# Patient Record
Sex: Male | Born: 1990 | Race: White | Hispanic: No | Marital: Single | Smoking: Never smoker
Health system: Southern US, Community
[De-identification: ages and names within clinical notes are randomized; demographics above are authoritative.]

## PROBLEM LIST (undated history)

## (undated) DIAGNOSIS — Z8719 Personal history of other diseases of the digestive system: Secondary | ICD-10-CM

## (undated) DIAGNOSIS — L600 Ingrowing nail: Secondary | ICD-10-CM

## (undated) DIAGNOSIS — B070 Plantar wart: Secondary | ICD-10-CM

## (undated) DIAGNOSIS — M24849 Other specific joint derangements of unspecified hand, not elsewhere classified: Secondary | ICD-10-CM

## (undated) HISTORY — DX: Plantar wart: B07.0

## (undated) HISTORY — DX: Personal history of other diseases of the digestive system: Z87.19

## (undated) HISTORY — DX: Other specific joint derangements of unspecified hand, not elsewhere classified: M24.849

## (undated) HISTORY — DX: Ingrowing nail: L60.0

---
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
## 2011-12-12 IMAGING — CT CT ABD-PELV W/ CM
2 of 4 series · 13 of 32 positions shown, 18 images · IV contrast (water/omni  & 100ml omni 300)
Comparison: None

CLINICAL DATA: Dysuria.  Painful urination.  Right-sided pain.

CT ABDOMEN AND PELVIS WITH CONTRAST
TECHNIQUE: Multidetector CT imaging of the abdomen and pelvis was
performed following the standard protocol during bolus
administration of intravenous contrast.
Contrast: 100 ml 4mnipaque-DRR

[Series 2: routine abdomen · axial · 0.70mm/px · z∈[-430,-90]mm · 5 of 104 slices shown, 10 images]
[im 18/104  soft-tissue]
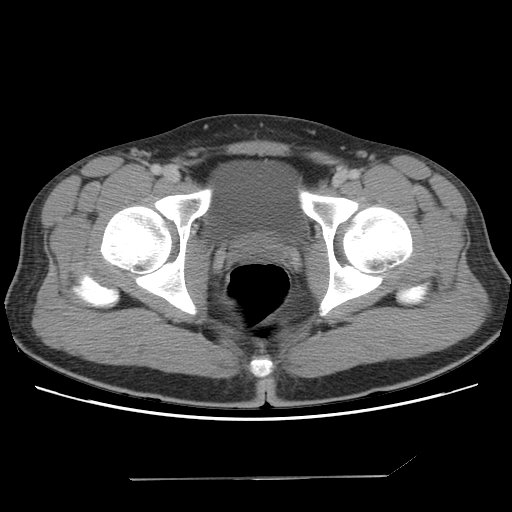
[im 18/104  bone]
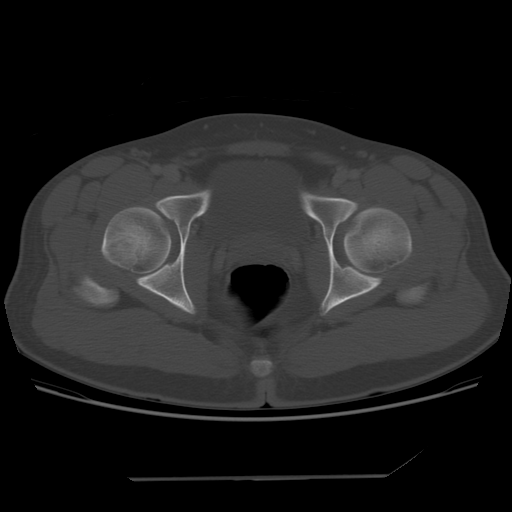
[im 35/104  soft-tissue]
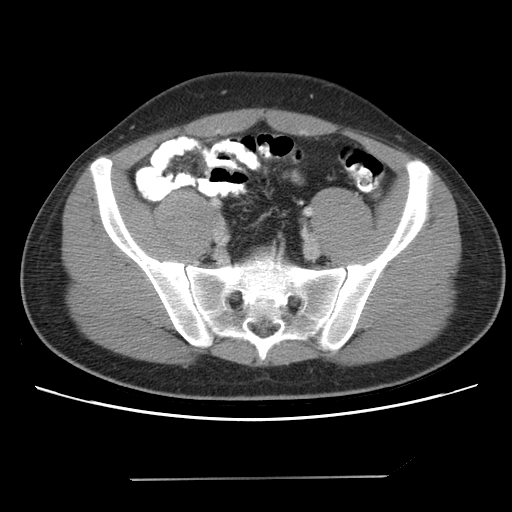
[im 35/104  lung]
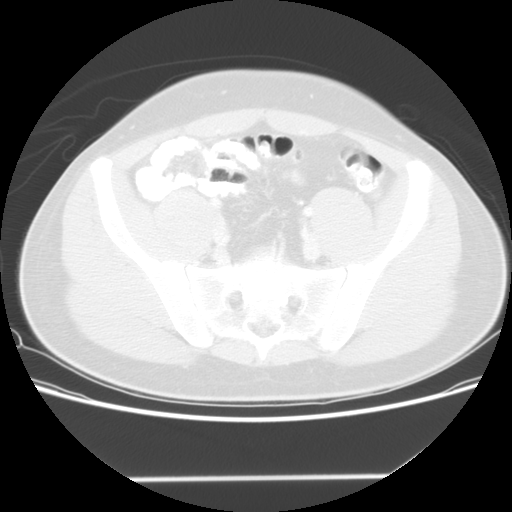
[im 52/104  soft-tissue]
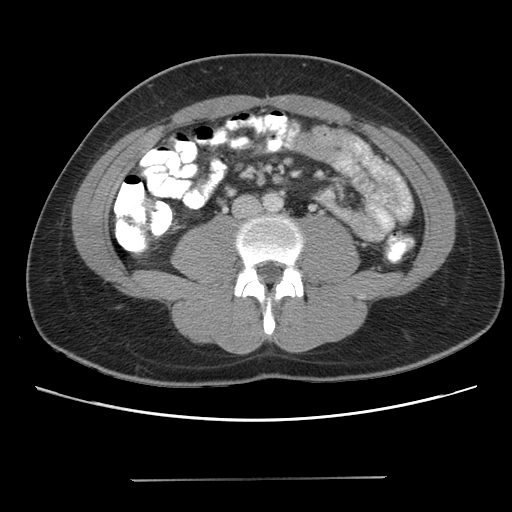
[im 52/104  lung]
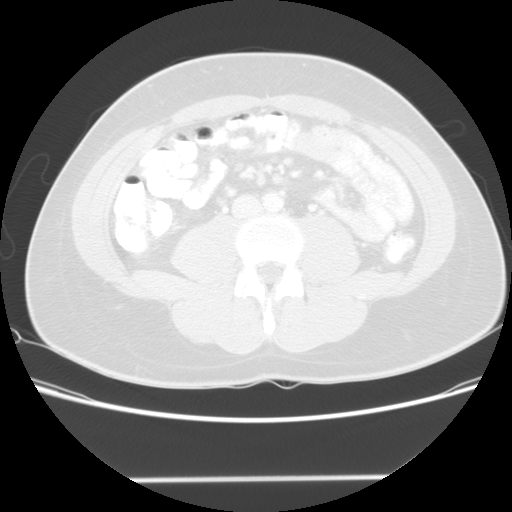
[im 69/104  soft-tissue]
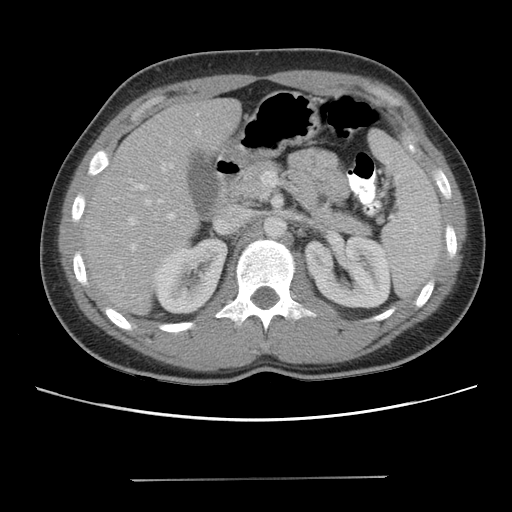
[im 69/104  lung]
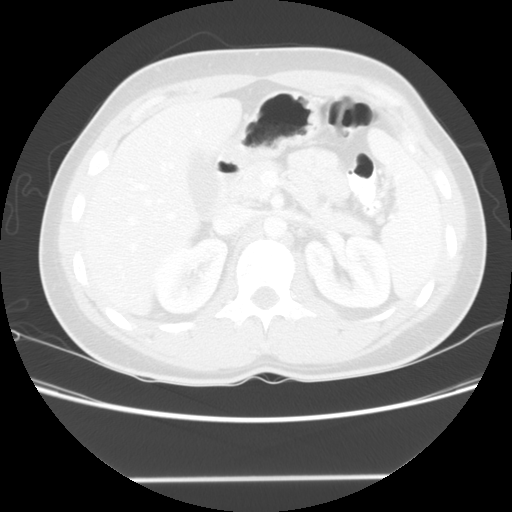
[im 86/104  soft-tissue]
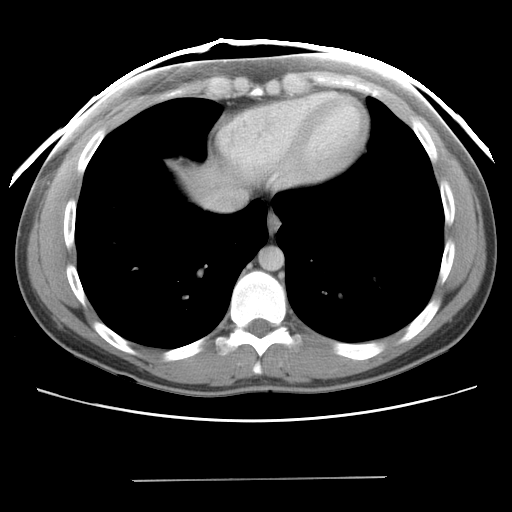
[im 86/104  lung]
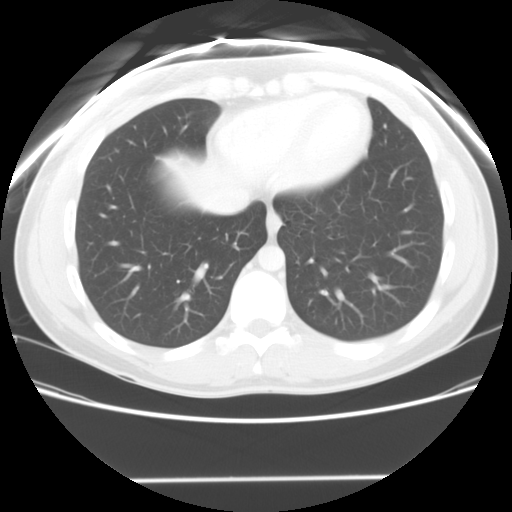

[Series 104: reformatted · sagittal · 1.19mm/px · 8 of 171 slices shown]
[im 16/171  soft-tissue]
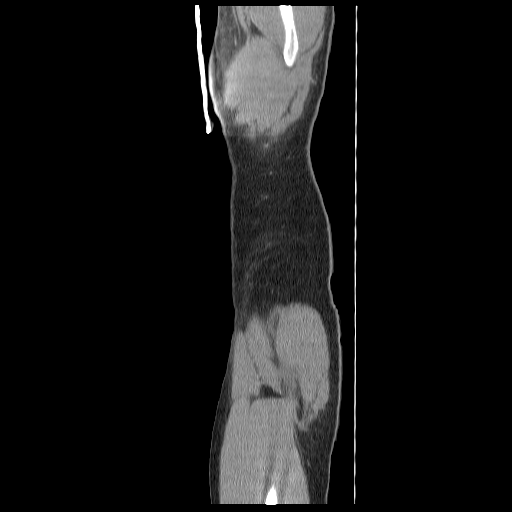
[im 31/171  soft-tissue]
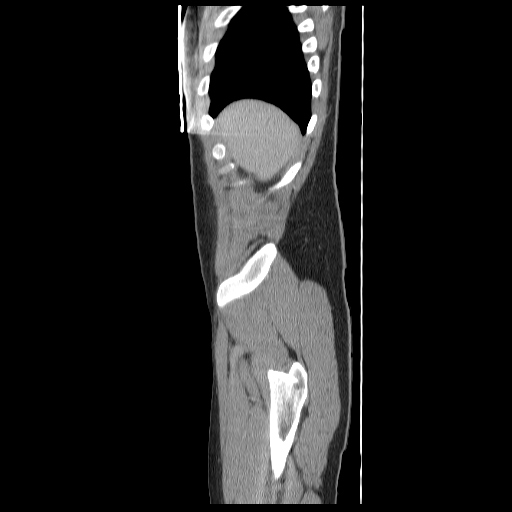
[im 62/171  soft-tissue]
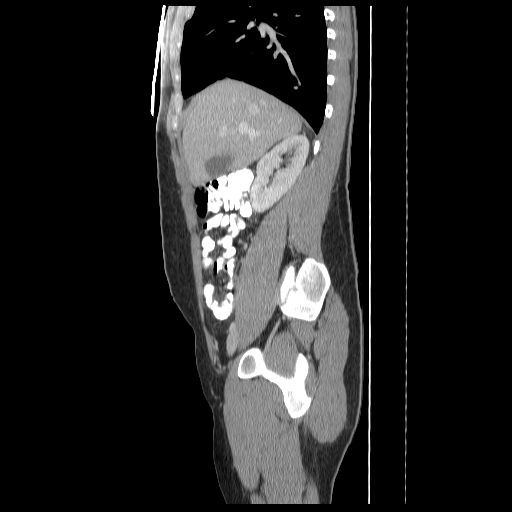
[im 78/171  soft-tissue]
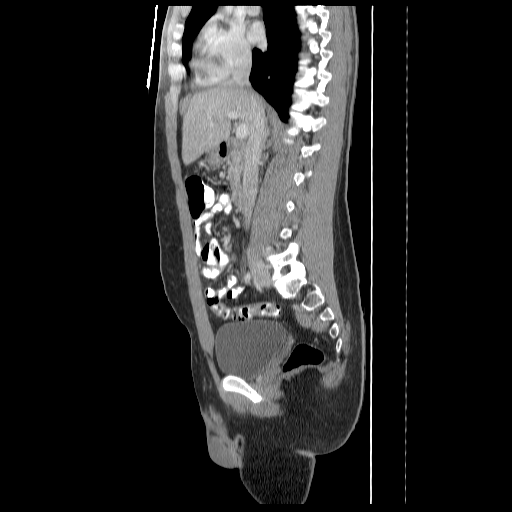
[im 93/171  soft-tissue]
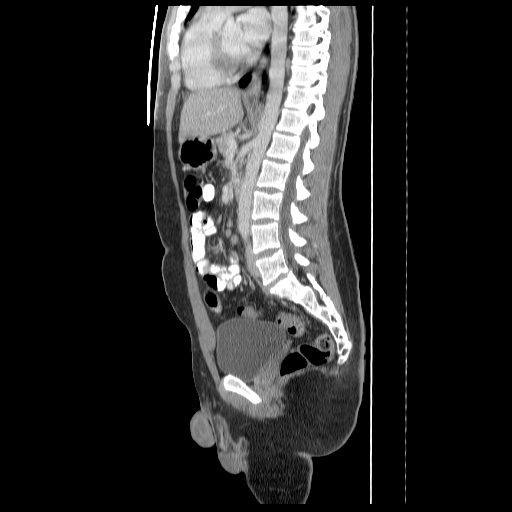
[im 109/171  soft-tissue]
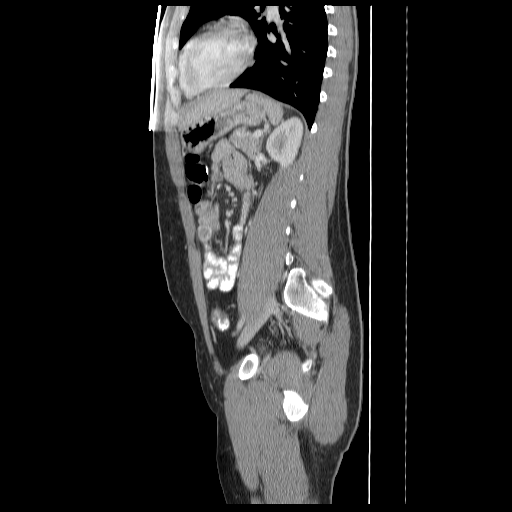
[im 140/171  soft-tissue]
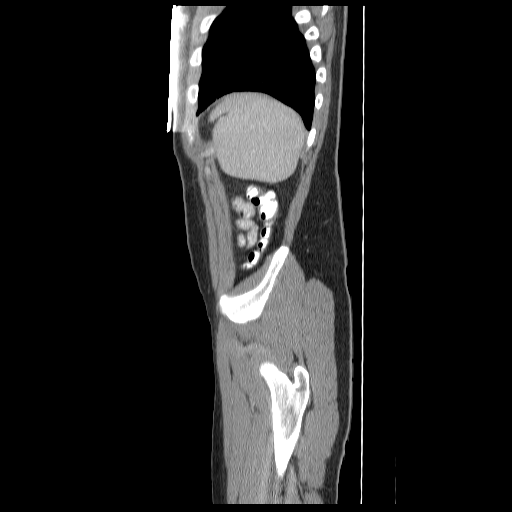
[im 155/171  soft-tissue]
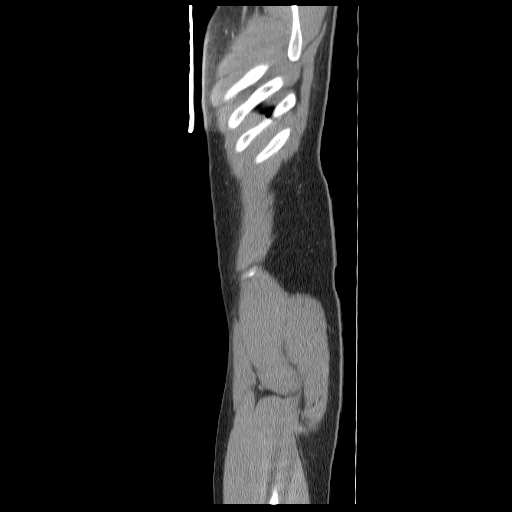

[13 of 32 positions shown; findings below may reference images not displayed]

FINDINGS: Lung bases are clear.  No pleural or pericardial fluid.
The liver has a normal appearance without focal lesions or biliary
ductal dilatation.  No calcified gallstones.  The spleen is normal.
The pancreas is normal.  The adrenal glands are normal.  Both
kidneys are normal in size shape and position.  No evidence of
cyst, mass, stone or hydronephrosis.  The aorta and IVC are normal.
No abnormalities seen along the course of either ureter.  The
bladder appears normal.  The prostate gland and seminal vesicles
appear normal.  No free fluid.  No bowel pathology is seen.  No
skeletal abnormality.  No detectable abnormality related to the
penis or testicles.
IMPRESSION: Normal examination.  No cause of the patient's described symptoms
is identified.

## 2012-03-23 HISTORY — PX: MELANOMA EXCISION WITH SENTINEL LYMPH NODE BIOPSY: SHX5267

## 2012-09-29 ENCOUNTER — Telehealth: Payer: Self-pay | Admitting: Family Medicine

## 2012-09-29 NOTE — Telephone Encounter (Signed)
PT mother called back to reschedule the pt's CPX, at our request. She states that he is away at school and has appt's scheduled around the 22nd and 23rd, and would like to keep his cpx within this time frame. If Dr. Tawanna Cooler cannot accommodate this, she is requesting to see another provider within the practice, just for this physical. Please assist.

## 2012-09-29 NOTE — Telephone Encounter (Signed)
Okay to see Dennis James please schedule

## 2012-09-30 NOTE — Telephone Encounter (Signed)
Pt scheduled for 7/22 at 10:00 with P Orvan Falconer

## 2012-09-30 NOTE — Telephone Encounter (Signed)
Called Mom & LMTCB to schedule.

## 2012-10-11 ENCOUNTER — Ambulatory Visit (INDEPENDENT_AMBULATORY_CARE_PROVIDER_SITE_OTHER): Payer: BC Managed Care – PPO | Admitting: Family

## 2012-10-11 ENCOUNTER — Encounter: Payer: Self-pay | Admitting: Family

## 2012-10-11 ENCOUNTER — Encounter: Payer: BC Managed Care – PPO | Admitting: Family Medicine

## 2012-10-11 VITALS — BP 100/72 | HR 68 | Ht 68.0 in | Wt 154.0 lb

## 2012-10-11 DIAGNOSIS — C439 Malignant melanoma of skin, unspecified: Secondary | ICD-10-CM

## 2012-10-11 DIAGNOSIS — Z0000 Encounter for general adult medical examination without abnormal findings: Secondary | ICD-10-CM

## 2012-10-11 LAB — LIPID PANEL
Cholesterol: 153 mg/dL (ref 0–200)
HDL: 57.5 mg/dL (ref >39.00)
LDL Cholesterol: 79 mg/dL (ref 0–99)
Total CHOL/HDL Ratio: 3
Triglycerides: 81 mg/dL (ref 0.0–149.0)
VLDL: 16.2 mg/dL (ref 0.0–40.0)

## 2012-10-11 LAB — CBC WITH DIFFERENTIAL/PLATELET
Basophils Absolute: 0 10*3/uL (ref 0.0–0.1)
Eosinophils Absolute: 0.1 10*3/uL (ref 0.0–0.7)
Hemoglobin: 15.3 g/dL (ref 13.0–17.0)
Lymphocytes Relative: 23.9 % (ref 12.0–46.0)
MCHC: 34.2 g/dL (ref 30.0–36.0)
Monocytes Relative: 8.3 % (ref 3.0–12.0)
Neutro Abs: 3.3 10*3/uL (ref 1.4–7.7)
Platelets: 213 10*3/uL (ref 150.0–400.0)
RDW: 14.5 % (ref 11.5–14.6)

## 2012-10-11 LAB — COMPREHENSIVE METABOLIC PANEL
AST: 19 U/L (ref 0–37)
Albumin: 4.4 g/dL (ref 3.5–5.2)
Alkaline Phosphatase: 55 U/L (ref 39–117)
BUN: 17 mg/dL (ref 6–23)
Creatinine, Ser: 0.8 mg/dL (ref 0.4–1.5)
Glucose, Bld: 92 mg/dL (ref 70–99)
Potassium: 4.4 mEq/L (ref 3.5–5.1)
Total Bilirubin: 1.5 mg/dL — ABNORMAL HIGH (ref 0.3–1.2)

## 2012-10-11 LAB — TSH: TSH: 0.9 u[IU]/mL (ref 0.35–5.50)

## 2012-10-11 LAB — POCT URINALYSIS DIPSTICK
Nitrite, UA: NEGATIVE
Protein, UA: NEGATIVE
Spec Grav, UA: >=1.03
Urobilinogen, UA: 1

## 2012-10-11 NOTE — Progress Notes (Signed)
  Subjective:    Patient ID: Dennis James, male    DOB: 07/06/1990, 22 y.o.   MRN: [id number]  HPI 22 year old white male, nonsmoker patient of Dr. Tawanna Cooler in for complete physical exam. Denies any concerns today. However, was diagnosed with a skin melanoma the skin in January. Subsequently, underwent resection of the right axilla area. Margins were clear. He seen dermatology every 3 months x1 year and every 6 months. He saw finding on CT scan of the chest abdomen and pelvis every 6 months. At this point, he's been cancer free.   Review of Systems  Constitutional: Negative.   HENT: Negative.   Eyes: Negative.   Respiratory: Negative.   Cardiovascular: Negative.   Gastrointestinal: Negative.   Endocrine: Negative.   Genitourinary: Negative.   Musculoskeletal: Negative.   Skin: Negative.   Allergic/Immunologic: Negative.   Neurological: Negative.   Hematological: Negative.   Psychiatric/Behavioral: Negative.    Past Medical History  Diagnosis Date  . Plantar wart 10/04/2008  . INGROWN TOENAIL, INFECTED 10/04/2008  . OTHER JOINT DERANGEMENT NEC HAND 01/28/2007  . RECTAL BLEEDING, HX OF 03/14/2008    History   Social History  . Marital Status: Single    Spouse Name: N/A    Number of Children: N/A  . Years of Education: N/A   Occupational History  . Not on file.   Social History Main Topics  . Smoking status: Never Smoker   . Smokeless tobacco: Not on file  . Alcohol Use: Not on file  . Drug Use: Not on file  . Sexually Active: Not on file   Other Topics Concern  . Not on file   Social History Narrative  . No narrative on file    History reviewed. No pertinent past surgical history.  No family history on file.  No Known Allergies  Current Outpatient Prescriptions on File Prior to Visit  Medication Sig Dispense Refill  . polyethylene glycol powder (MIRALAX) powder Take 17 g by mouth as needed.         No current facility-administered medications on file prior to  visit.    BP 100/72  Pulse 68  Ht 5\' 8"  (1.727 m)  Wt 154 lb (69.854 kg)  BMI 23.42 kg/m2  SpO2 98%chart    Objective:   Physical Exam  Constitutional: He is oriented to person, place, and time. He appears well-developed and well-nourished.  HENT:  Head: Normocephalic.  Right Ear: External ear normal.  Left Ear: External ear normal.  Mouth/Throat: Oropharynx is clear and moist.  Eyes: Pupils are equal, round, and reactive to light.  Neck: Normal range of motion. Neck supple. Thyromegaly present.  Cardiovascular: Normal rate, regular rhythm and normal heart sounds.   Pulmonary/Chest: Effort normal and breath sounds normal.  Abdominal: Soft. Bowel sounds are normal.  Musculoskeletal: Normal range of motion.  Neurological: He is alert and oriented to person, place, and time. He has normal reflexes. No cranial nerve deficit.  Skin: Skin is warm and dry. No rash noted.     Psychiatric: He has a normal mood and affect.          Assessment & Plan:  Assessment: 1. Complete physical exam 2. Stage II melanoma-resolved  Plan: Labs sent to include lipids, CBC, TSH, urine, CMP when an outpatient and of itself. Continue to follow up with dermatology as scheduled. Recheck here in one year and sooner if needed.

## 2012-10-11 NOTE — Patient Instructions (Signed)
Self-Exam Of The Testicles Young men ages 107-35 are the ones who most commonly get cancer of the testicles. About 300 young men die of this disease each year. Testicular cancer does occur in middle-aged or older men but to a lesser extent. This cancer can almost always be cured if it is found before it gets bad and if it is treated. WORDS TO KNOW:  Testicles are the 2 egg-shaped glands that make hormones and sperm in men.  The scrotum is the skin around testicles.  Epididymis is the rope-like part that is behind and above each testis. It collects sperm made by the testis. WHICH MEN ARE AT HIGH RISK FOR CANCER OF THE TESTICLES?  Men between ages 28 to 45.  Men who are Caucasian.  Men who were born with a testicle that had not moved down into the scrotum.  Men whose testicles have gotten smaller because of an infection.  Men whose fathers or brothers have had cancer of the testicles. SYMPTOMS OF TESTICULAR CANCER  A painless swelling in one of your testicles.  A hard lump. Some lumps may be an infection.  A heavy feeling in your testicles.  An ache in your lower belly (abdomen) or groin. HOW OFTEN SHOULD I CHECK MY TESTICLES? You should check your testicles every month. HOW SHOULD I DO THIS CHECK? It is best to check your testicles right after a warm shower or bath.  Look at your testicles for any swelling. You may need to use a mirror.  Use both your hands to roll each testicle between your thumb and fingers. Feel for any lumps or changes in the size of the testicle. Press firmly. You may find that one testicle is a little bigger than the other. This is normal.  Next, check the epididymis on each testicle. This is the part where most testicular cancers happen. It is normal for the epididymis to feel soft and uneven. When you get used to how your epididymis feels, you will be able to tell if there is any change.  WHAT IF I FIND ANY SWELLINGS OR LUMPS?  Call your doctor. Some lumps  may be an infection. If the lump or swelling is a cancer, it can be treated before it gets worse.  Document Released: 06/05/2008 Document Revised: 06/01/2011 Document Reviewed: 06/05/2008 [facility] Patient Information 2014 [place], [place].

## 2012-10-12 ENCOUNTER — Telehealth: Payer: Self-pay | Admitting: Family Medicine

## 2012-10-12 NOTE — Telephone Encounter (Signed)
Pt aware he had a tetanus in 2007 and was not charged for one at [facility]

## 2012-10-12 NOTE — Telephone Encounter (Signed)
PT mother called and stated that the pt thought he was supposed to have a tetanus shot yesterday. She states that someone mentioned it to him, but he never received it. She is inquiring to see if he needs to have it done, and if he was charged for it. Please assist.

## 2013-10-03 ENCOUNTER — Encounter: Payer: BC Managed Care – PPO | Admitting: Family Medicine

## 2013-10-19 ENCOUNTER — Encounter: Payer: BC Managed Care – PPO | Admitting: Family Medicine

## 2013-11-07 ENCOUNTER — Ambulatory Visit (INDEPENDENT_AMBULATORY_CARE_PROVIDER_SITE_OTHER): Payer: BC Managed Care – PPO | Admitting: Family Medicine

## 2013-11-07 ENCOUNTER — Encounter: Payer: Self-pay | Admitting: Family Medicine

## 2013-11-07 VITALS — BP 130/90 | Temp 98.0°F | Ht 67.5 in | Wt 170.0 lb

## 2013-11-07 DIAGNOSIS — C439 Malignant melanoma of skin, unspecified: Secondary | ICD-10-CM

## 2013-11-07 NOTE — Progress Notes (Signed)
   Subjective:    Patient ID: Dennis James, male    DOB: 1990/04/08, 23 y.o.   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 Had excised it Marijo File turned out to be a melanoma. Stage II. He had wide excision and sentinel node biopsy which were negative. He's followed by dermatology in [place]. He goes every 3 months.   Review of Systems Review of systems otherwise negative except for worries about the melanoma    Objective:   Physical Exam  Well-developed well-nourished male no acute distress vital signs stable he is afebrile HEENT negative neck was supple no adenopathy thyroid normal cardiopulmonary exam normal abdominal exam normal extremities normal skin,,,,,,,,,,,,,,, total body skin exam normal except for his 2 scars in the right axillary one was the wide excision for melanoma the other scar was a smaller scar from the sentinel node biopsy.      Assessment & Plan:  Healthy male  Status post melanoma right axillary a......... level II by history...Marland KitchenMarland KitchenMarland Kitchen followed carefully by dermatology every 3 months in [place]

## 2013-11-07 NOTE — Progress Notes (Signed)
Pre visit review using our clinic review tool, if applicable. No additional management support is needed unless otherwise documented below in the visit note. 

## 2013-11-07 NOTE — Patient Instructions (Signed)
Continue your close followup with her dermatologist in [place]  Return when necessary  You might want to extremity with her diet,,,,,,,,,,,,, in addition to avoiding lactose,,,,,,,,,, gluten can also be a culprit

## 2014-03-20 ENCOUNTER — Encounter: Payer: Self-pay | Admitting: Family Medicine

## 2014-03-20 ENCOUNTER — Ambulatory Visit (INDEPENDENT_AMBULATORY_CARE_PROVIDER_SITE_OTHER): Payer: BC Managed Care – PPO | Admitting: Family Medicine

## 2014-03-20 VITALS — BP 112/70 | HR 105 | Temp 97.9°F | Ht 67.5 in | Wt 160.6 lb

## 2014-03-20 DIAGNOSIS — J069 Acute upper respiratory infection, unspecified: Secondary | ICD-10-CM

## 2014-03-20 NOTE — Progress Notes (Signed)
Pre visit review using our clinic review tool, if applicable. No additional management support is needed unless otherwise documented below in the visit note. 

## 2014-03-20 NOTE — Patient Instructions (Signed)

## 2014-03-20 NOTE — Progress Notes (Signed)
HPI:  URI: -started: about 2 weeks ago -symptoms:nasal congestion, sore throat, cough, swollen nodes - improving some -denies:fever, SOB, NVD, tooth pain, sinus pain -has tried: OTC things -sick contacts/travel/risks: denies flu exposure, tick exposure or or Ebola risks -Hx of: under a lot of stress with finishing college and with getting survaillience for his hx of melanoma ROS: See pertinent positives and negatives per HPI.  Past Medical History  Diagnosis Date  . Plantar wart 10/04/2008  . INGROWN TOENAIL, INFECTED 10/04/2008  . OTHER JOINT DERANGEMENT NEC HAND 01/28/2007  . RECTAL BLEEDING, HX OF 03/14/2008    Past Surgical History  Procedure Laterality Date  . Melanoma excision with sentinel lymph node biopsy Right 2014    No family history on file.  History   Social History  . Marital Status: Single    Spouse Name: N/A    Number of Children: N/A  . Years of Education: N/A   Social History Main Topics  . Smoking status: Never Smoker   . Smokeless tobacco: None  . Alcohol Use: None  . Drug Use: None  . Sexual Activity: None   Other Topics Concern  . None   Social History Narrative    Current outpatient prescriptions: Ascorbic Acid (VITAMIN C PO), Take by mouth daily., Disp: , Rfl: ;  Multiple Vitamin (MULTIVITAMIN) capsule, Take 1 capsule by mouth daily., Disp: , Rfl:   EXAM:  Filed Vitals:   03/20/14 1103  BP: 112/70  Pulse: 105  Temp: 97.9 F (36.6 C)    Body mass index is 24.77 kg/(m^2).  GENERAL: vitals reviewed and listed above, alert, oriented, appears well hydrated and in no acute distress  HEENT: atraumatic, conjunttiva clear, no obvious abnormalities on inspection of external nose and ears, normal appearance of ear canals and TMs, clear nasal congestion, mild post oropharyngeal erythema with PND, no tonsillar edema or exudate, no sinus TTP  NECK: no obvious masses on inspection  LUNGS: clear to auscultation bilaterally, no wheezes,  rales or rhonchi, good air movement  CV: HRRR, no peripheral edema  MS: moves all extremities without noticeable abnormality  PSYCH: pleasant and cooperative, no obvious depression or anxiety  ASSESSMENT AND PLAN:  Discussed the following assessment and plan:  Acute upper respiratory infection  -given HPI and exam findings today, a serious infection or illness is unlikely. We discussed potential etiologies, with VURI being most likely, and advised supportive care and monitoring. We discussed treatment side effects, likely course, antibiotic misuse, transmission, and signs of developing a serious illness. -of course, we advised to return or notify a doctor immediately if symptoms worsen or persist or new concerns arise.    Patient Instructions  INSTRUCTIONS FOR UPPER RESPIRATORY INFECTION:  -plenty of rest and fluids  -nasal saline wash 2-3 times daily (use prepackaged nasal saline or bottled/distilled water if making your own)   -can use AFRIN nasal spray for drainage and nasal congestion - but do NOT use longer then 3-4 days  -can use tylenol or ibuprofen as directed for aches and sorethroat  -in the winter time, using a humidifier at night is helpful (please follow cleaning instructions)  -if you are taking a cough medication - use only as directed, may also try a teaspoon of honey to coat the throat and throat lozenges  -for sore throat, salt water gargles can help  -follow up if you have fevers, facial pain, tooth pain, difficulty breathing or are worsening or not getting better in 5-7 days  Colin Benton R.

## 2014-03-26 ENCOUNTER — Ambulatory Visit (INDEPENDENT_AMBULATORY_CARE_PROVIDER_SITE_OTHER): Payer: BC Managed Care – PPO | Admitting: Family Medicine

## 2014-03-26 ENCOUNTER — Encounter: Payer: Self-pay | Admitting: Family Medicine

## 2014-03-26 VITALS — BP 120/80 | Temp 98.4°F | Wt 160.0 lb

## 2014-03-26 DIAGNOSIS — J302 Other seasonal allergic rhinitis: Secondary | ICD-10-CM

## 2014-03-26 DIAGNOSIS — Z23 Encounter for immunization: Secondary | ICD-10-CM

## 2014-03-26 DIAGNOSIS — J309 Allergic rhinitis, unspecified: Secondary | ICD-10-CM | POA: Insufficient documentation

## 2014-03-26 NOTE — Progress Notes (Signed)
   Subjective:    Patient ID: Dennis James, male    DOB: 1990-09-14, 24 y.o.   MRN: [id number]  HPI Dennis James is a 24 year old single male nonsmoker recent college graduate........ degree in environmental sciences........ who comes in today for evaluation of a couple issues  He says he has STD testing at [place] [facility] in November and was all negative but would like it repeated. I explained to him since it was all negative is no need to repeat it  For the past month she's had head congestion postnasal drip and a cough. He said no fever no sputum production. The cough seems to be worse in the morning and he has a lot of nasal drainage. He's had a history of allergic rhinitis in the past no asthma  2 years ago he had a lesion removed from his right axillary area that would turn out to be a level II melanoma. He had a wide excision done with negative nodes. He's been followed by dermatologist in [place]. He says his dermatologist is been working with him to reassure him that he doesn't have recurrent cancer but he is concerned therefore is not even look for a job yet until he gets his next CT scan.  He also has some swollen lymph nodes in his neck.   Review of Systems    review of systems negative Objective:   Physical Exam  Well-developed well-nourished male no acute distress vital signs stable he is afebrile HEENT were negative except for postnasal drip neck was supple no adenopathy lungs are clear. There is a BB sized lesion left posterior neck area. It soft rubbery and movable      Assessment & Plan:  Allergic rhinitis............Marland Kitchen Zyrtec,,,,,,, 1 at bedtime  History of level II melanoma with wide excision and no evidence of spreading........ followed by dermatology every 6 months  Benign lymphadenopathy  Recent STD testing November 2015 [place]....... all normal........ therefore not repeated

## 2014-03-26 NOTE — Patient Instructions (Signed)
Zyrtec 10 mg........... one daily at bedtime  If after couple weeks your still having a lot of head congestion draining and cough then add the steroid nasal spray........... one shot up each nostril at bedtime  Return when necessary

## 2014-03-26 NOTE — Progress Notes (Signed)
Pre visit review using our clinic review tool, if applicable. No additional management support is needed unless otherwise documented below in the visit note.
